# Patient Record
Sex: Female | Born: 1965 | Race: White | Hispanic: No | Smoking: Never smoker
Health system: Southern US, Community
[De-identification: ages and names within clinical notes are randomized; demographics above are authoritative.]

## PROBLEM LIST (undated history)

## (undated) DIAGNOSIS — G43909 Migraine, unspecified, not intractable, without status migrainosus: Secondary | ICD-10-CM

## (undated) HISTORY — DX: Migraine, unspecified, not intractable, without status migrainosus: G43.909

## (undated) HISTORY — PX: NO PAST SURGERIES: SHX2092

---

## 2018-10-29 ENCOUNTER — Ambulatory Visit (INDEPENDENT_AMBULATORY_CARE_PROVIDER_SITE_OTHER): Payer: BLUE CROSS/BLUE SHIELD

## 2018-10-29 ENCOUNTER — Ambulatory Visit: Payer: BLUE CROSS/BLUE SHIELD | Admitting: Sports Medicine

## 2018-10-29 ENCOUNTER — Encounter: Payer: Self-pay | Admitting: Sports Medicine

## 2018-10-29 DIAGNOSIS — M1711 Unilateral primary osteoarthritis, right knee: Secondary | ICD-10-CM

## 2018-10-29 DIAGNOSIS — M25562 Pain in left knee: Secondary | ICD-10-CM

## 2018-10-29 DIAGNOSIS — M7121 Synovial cyst of popliteal space [Baker], right knee: Secondary | ICD-10-CM

## 2018-10-29 DIAGNOSIS — M25561 Pain in right knee: Secondary | ICD-10-CM

## 2018-10-29 NOTE — Progress Notes (Signed)
Subjective:    I'm seeing this patient as a consultation for: Dr. Alona Bene  CC: Right knee pain  HPI: This is a pleasant 52 year old female, for many months she is had pain that she localizes over the posterior aspect of her right knee, her PCP ordered an ultrasound appropriately which showed a Baker's cyst.  She is here for further evaluation and definitive treatment, she does have some pain anteriorly in the knee with occasional swelling, moderate, persistent without radiation.  I reviewed the past medical history, family history, social history, surgical history, and allergies today and no changes were needed.  Please see the problem list section below in epic for further details.  Past Medical History: Past Medical History:  Diagnosis Date  . Migraine    Past Surgical History: Past Surgical History:  Procedure Laterality Date  . NO PAST SURGERIES     Social History: Social History   Socioeconomic History  . Marital status: Divorced    Spouse name: Not on file  . Number of children: Not on file  . Years of education: Not on file  . Highest education level: Not on file  Occupational History  . Not on file  Social Needs  . Financial resource strain: Not on file  . Food insecurity:    Worry: Not on file    Inability: Not on file  . Transportation needs:    Medical: Not on file    Non-medical: Not on file  Tobacco Use  . Smoking status: Never Smoker  . Smokeless tobacco: Never Used  Substance and Sexual Activity  . Alcohol use: Yes  . Drug use: Never  . Sexual activity: Not on file  Lifestyle  . Physical activity:    Days per week: Not on file    Minutes per session: Not on file  . Stress: Not on file  Relationships  . Social connections:    Talks on phone: Not on file    Gets together: Not on file    Attends religious service: Not on file    Active member of club or organization: Not on file    Attends meetings of clubs or organizations: Not on file    Relationship status: Not on file  Other Topics Concern  . Not on file  Social History Narrative  . Not on file   Family History: No family history on file. Allergies: Allergies not on file Medications: See med rec.  Review of Systems: No headache, visual changes, nausea, vomiting, diarrhea, constipation, dizziness, abdominal pain, skin rash, fevers, chills, night sweats, weight loss, swollen lymph nodes, body aches, joint swelling, muscle aches, chest pain, shortness of breath, mood changes, visual or auditory hallucinations.   Objective:   General: Well Developed, well nourished, and in no acute distress.  Neuro:  Extra-ocular muscles intact, able to move all 4 extremities, sensation grossly intact.  Deep tendon reflexes tested were normal. Psych: Alert and oriented, mood congruent with affect. ENT:  Ears and nose appear unremarkable.  Hearing grossly normal. Neck: Unremarkable overall appearance, trachea midline.  No visible thyroid enlargement. Eyes: Conjunctivae and lids appear unremarkable.  Pupils equal and round. Skin: Warm and dry, no rashes noted.  Cardiovascular: Pulses palpable, no extremity edema. Right knee: Normal to inspection with no erythema or effusion or obvious bony abnormalities. Palpable Baker's cyst on the posterior knee, minimal tenderness anteriorly and at the medial joint line ROM normal in flexion and extension and lower leg rotation. Ligaments with solid consistent endpoints  including ACL, PCL, LCL, MCL. Negative Mcmurray's and provocative meniscal tests. Non painful patellar compression. Patellar and quadriceps tendons unremarkable. Hamstring and quadriceps strength is normal.  Procedure: Real-time Ultrasound Guided aspiration/injection of Baker's cyst Device: GE Logiq E  Verbal informed consent obtained.  Time-out conducted.  Noted no overlying erythema, induration, or other signs of local infection.  Skin prepped in a sterile fashion.  Local  anesthesia: Topical Ethyl chloride.  With sterile technique and under real time ultrasound guidance: Using an 18-gauge needle advanced into a very large Baker's cyst, aspirated 7 mL of clear, straw-colored fluid, syringe switched and 1 cc Kenalog 40, 1 cc lidocaine injected easily Completed without difficulty  Pain immediately resolved suggesting accurate placement of the medication.  Advised to call if fevers/chills, erythema, induration, drainage, or persistent bleeding.  Images permanently stored and available for review in the ultrasound unit.  Impression: Technically successful ultrasound guided injection.  Procedure: Real-time Ultrasound Guided Injection of right knee Device: GE Logiq E  Verbal informed consent obtained.  Time-out conducted.  Noted no overlying erythema, induration, or other signs of local infection.  Skin prepped in a sterile fashion.  Local anesthesia: Topical Ethyl chloride.  With sterile technique and under real time ultrasound guidance: Patient flipped over, I then advanced a 25-gauge needle into the suprapatellar recess and injected 1 cc Kenalog 40, 2 cc lidocaine, 2 cc bupivacaine. Completed without difficulty  Pain immediately resolved suggesting accurate placement of the medication.  Advised to call if fevers/chills, erythema, induration, drainage, or persistent bleeding.  Images permanently stored and available for review in the ultrasound unit.  Impression: Technically successful ultrasound guided injection.  Impression and Recommendations:   This case required medical decision making of moderate complexity.  Primary osteoarthritis of right knee With a Baker's cyst. Baseline x-rays. Aspiration and injection of the knee and Baker's cyst. Strap with compressive dressing. Return to see me in a month. ___________________________________________ Ihor Austinhomas J. Benjamin Stainhekkekandam, M.D., ABFM., CAQSM. Primary Care and Sports Medicine [facility]  [facility]  Adjunct Professor of Family Medicine  [employer]

## 2018-10-29 NOTE — Assessment & Plan Note (Signed)
With a Baker's cyst. Baseline x-rays. Aspiration and injection of the knee and Baker's cyst. Strap with compressive dressing. Return to see me in a month.

## 2018-11-26 ENCOUNTER — Ambulatory Visit (INDEPENDENT_AMBULATORY_CARE_PROVIDER_SITE_OTHER): Payer: BLUE CROSS/BLUE SHIELD | Admitting: Sports Medicine

## 2018-11-26 ENCOUNTER — Encounter: Payer: Self-pay | Admitting: Sports Medicine

## 2018-11-26 DIAGNOSIS — M1711 Unilateral primary osteoarthritis, right knee: Secondary | ICD-10-CM

## 2018-11-26 NOTE — Assessment & Plan Note (Signed)
With the Baker's cyst, aspiration and injection performed a month ago, she returns today for the most part pain-free. Return as needed.

## 2018-11-26 NOTE — Progress Notes (Signed)
Subjective:    CC: Follow-up  HPI: Kimberly Summers returns, she is a pleasant 52 year old female, we did an aspiration and injection of a Baker's cyst, as well as an intra-articular injection at the last visit a month ago.  She did extremely well, she was completely pain-free, unfortunately tripped over some presents at Christmas, this created some increasing swelling but now she is back to a 1/10 on the pain scale, and continuing to improve.  I reviewed the past medical history, family history, social history, surgical history, and allergies today and no changes were needed.  Please see the problem list section below in epic for further details.  Past Medical History: Past Medical History:  Diagnosis Date  . Migraine    Past Surgical History: Past Surgical History:  Procedure Laterality Date  . NO PAST SURGERIES     Social History: Social History   Socioeconomic History  . Marital status: Divorced    Spouse name: Not on file  . Number of children: Not on file  . Years of education: Not on file  . Highest education level: Not on file  Occupational History  . Not on file  Social Needs  . Financial resource strain: Not on file  . Food insecurity:    Worry: Not on file    Inability: Not on file  . Transportation needs:    Medical: Not on file    Non-medical: Not on file  Tobacco Use  . Smoking status: Never Smoker  . Smokeless tobacco: Never Used  Substance and Sexual Activity  . Alcohol use: Yes  . Drug use: Never  . Sexual activity: Not on file  Lifestyle  . Physical activity:    Days per week: Not on file    Minutes per session: Not on file  . Stress: Not on file  Relationships  . Social connections:    Talks on phone: Not on file    Gets together: Not on file    Attends religious service: Not on file    Active member of club or organization: Not on file    Attends meetings of clubs or organizations: Not on file    Relationship status: Not on file  Other Topics  Concern  . Not on file  Social History Narrative  . Not on file   Family History: No family history on file. Allergies: No Known Allergies Medications: See med rec.  Review of Systems: No fevers, chills, night sweats, weight loss, chest pain, or shortness of breath.   Objective:    General: Well Developed, well nourished, and in no acute distress.  Neuro: Alert and oriented x3, extra-ocular muscles intact, sensation grossly intact.  HEENT: Normocephalic, atraumatic, pupils equal round reactive to light, neck supple, no masses, no lymphadenopathy, thyroid nonpalpable.  Skin: Warm and dry, no rashes. Cardiac: Regular rate and rhythm, no murmurs rubs or gallops, no lower extremity edema.  Respiratory: Clear to auscultation bilaterally. Not using accessory muscles, speaking in full sentences. Right knee: Normal to inspection with no erythema or effusion or obvious bony abnormalities. Palpation normal with no warmth or joint line tenderness or patellar tenderness or condyle tenderness. ROM normal in flexion and extension and lower leg rotation. Ligaments with solid consistent endpoints including ACL, PCL, LCL, MCL. Negative Mcmurray's and provocative meniscal tests. Non painful patellar compression. Patellar and quadriceps tendons unremarkable. Hamstring and quadriceps strength is normal.  Impression and Recommendations:    Primary osteoarthritis of right knee With the Baker's cyst, aspiration and injection performed  a month ago, she returns today for the most part pain-free. Return as needed. ___________________________________________ Kimberly Summers, M.D., ABFM., CAQSM. Primary Care and Sports [facility]  Adjunct Professor of Family Medicine  [employer]

## 2019-12-28 IMAGING — DX DG KNEE 1-2V*L*
4 series · 4 of 4 positions shown · non-contrast
Comparison: None.

CLINICAL DATA: Knee pain for several months. The patient reports a
Baker's cyst on the right. No known injury.

EXAM:
RIGHT KNEE - COMPLETE 4+ VIEW; LEFT KNEE - 1-2 VIEW

[knee lat]
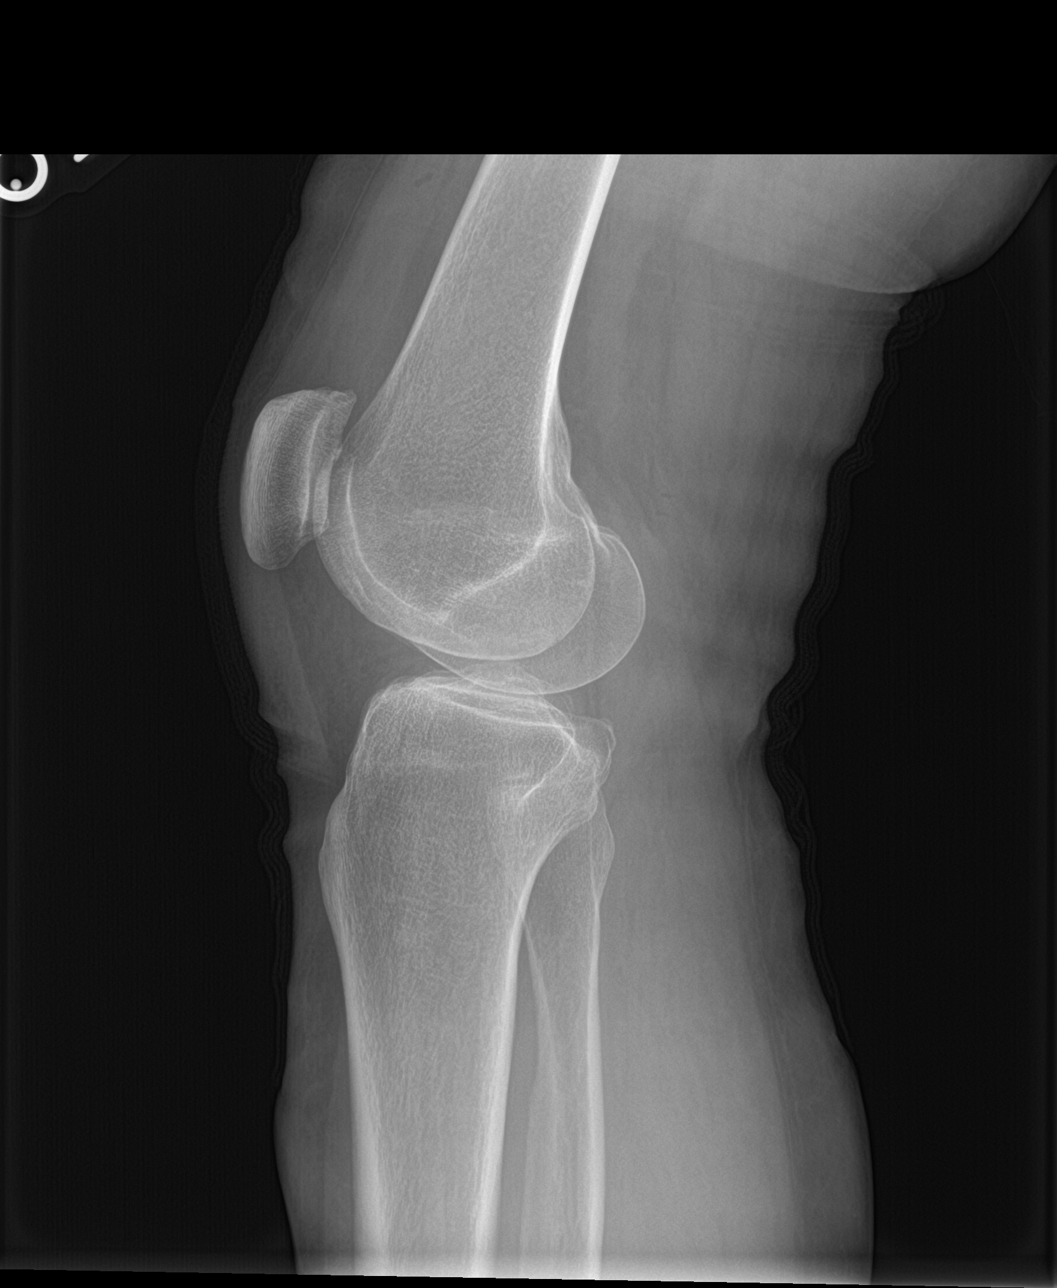

[knee sunrise]
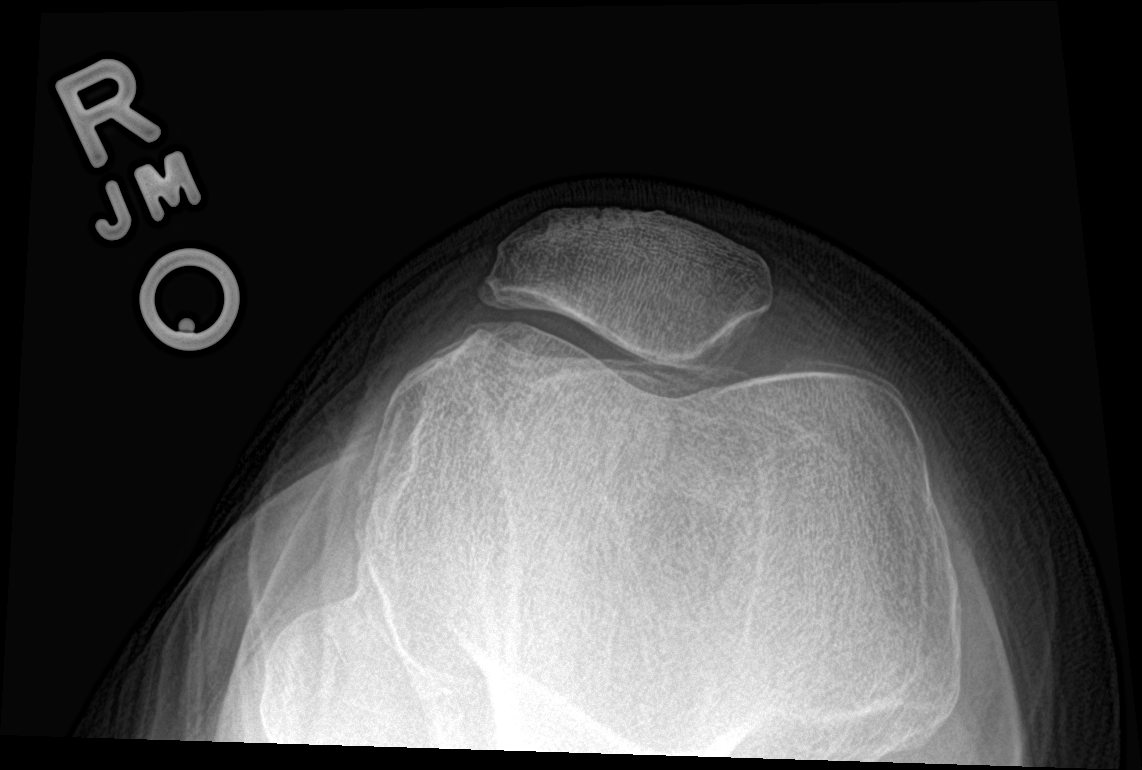

[knee ap bilat standing (1 of 2)]
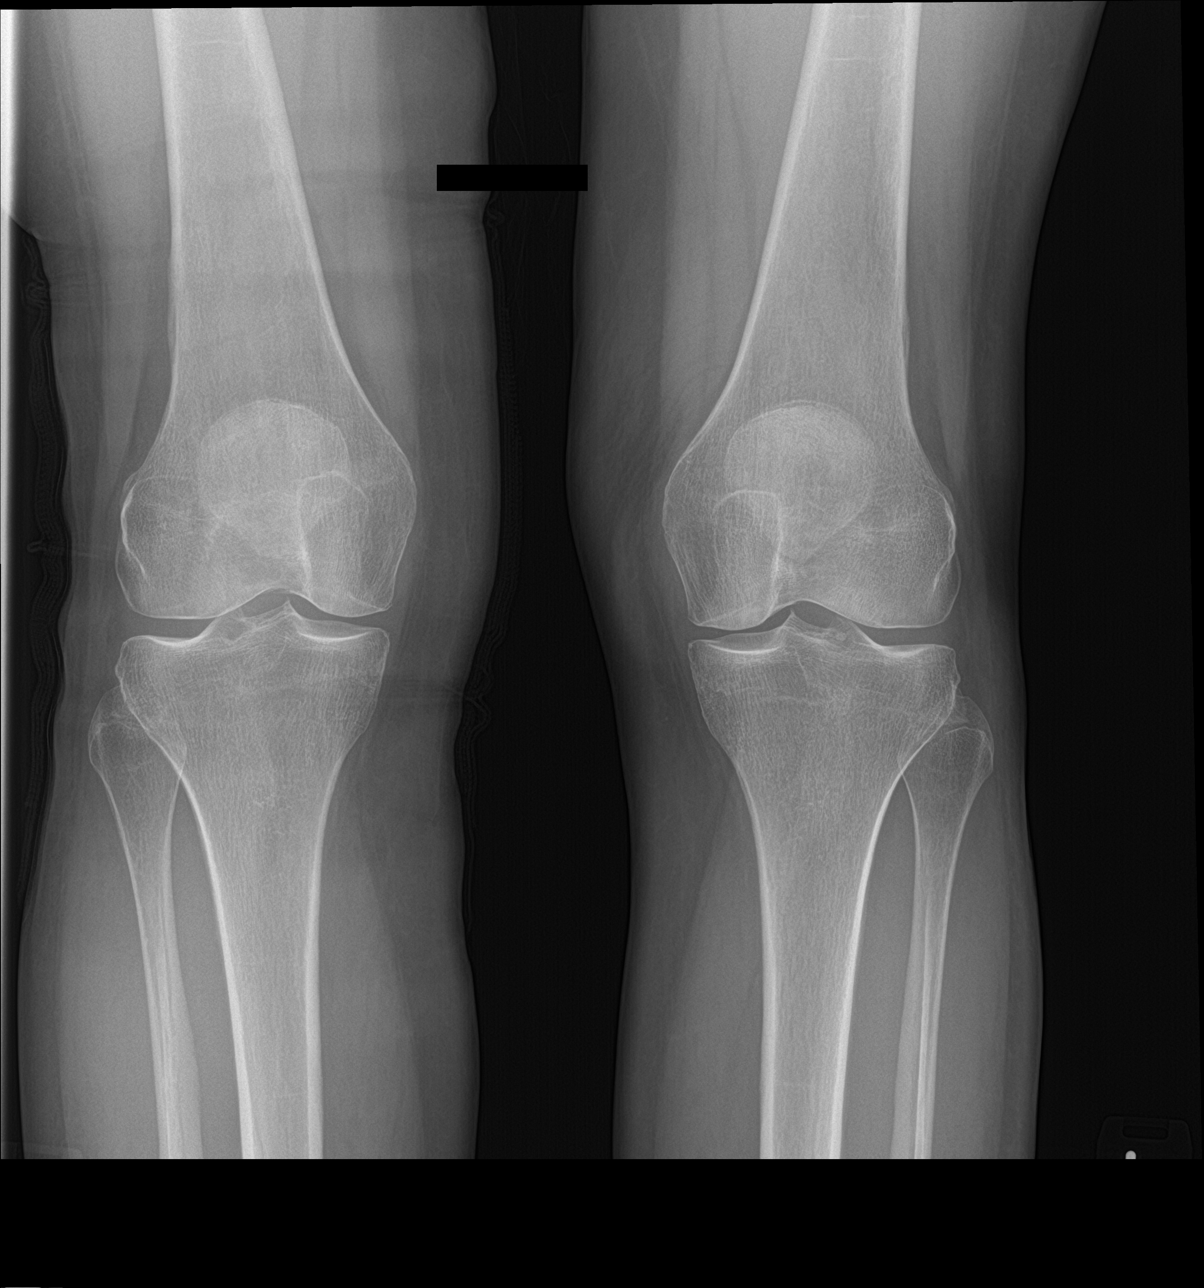

[knee ap bilat standing (2 of 2)]
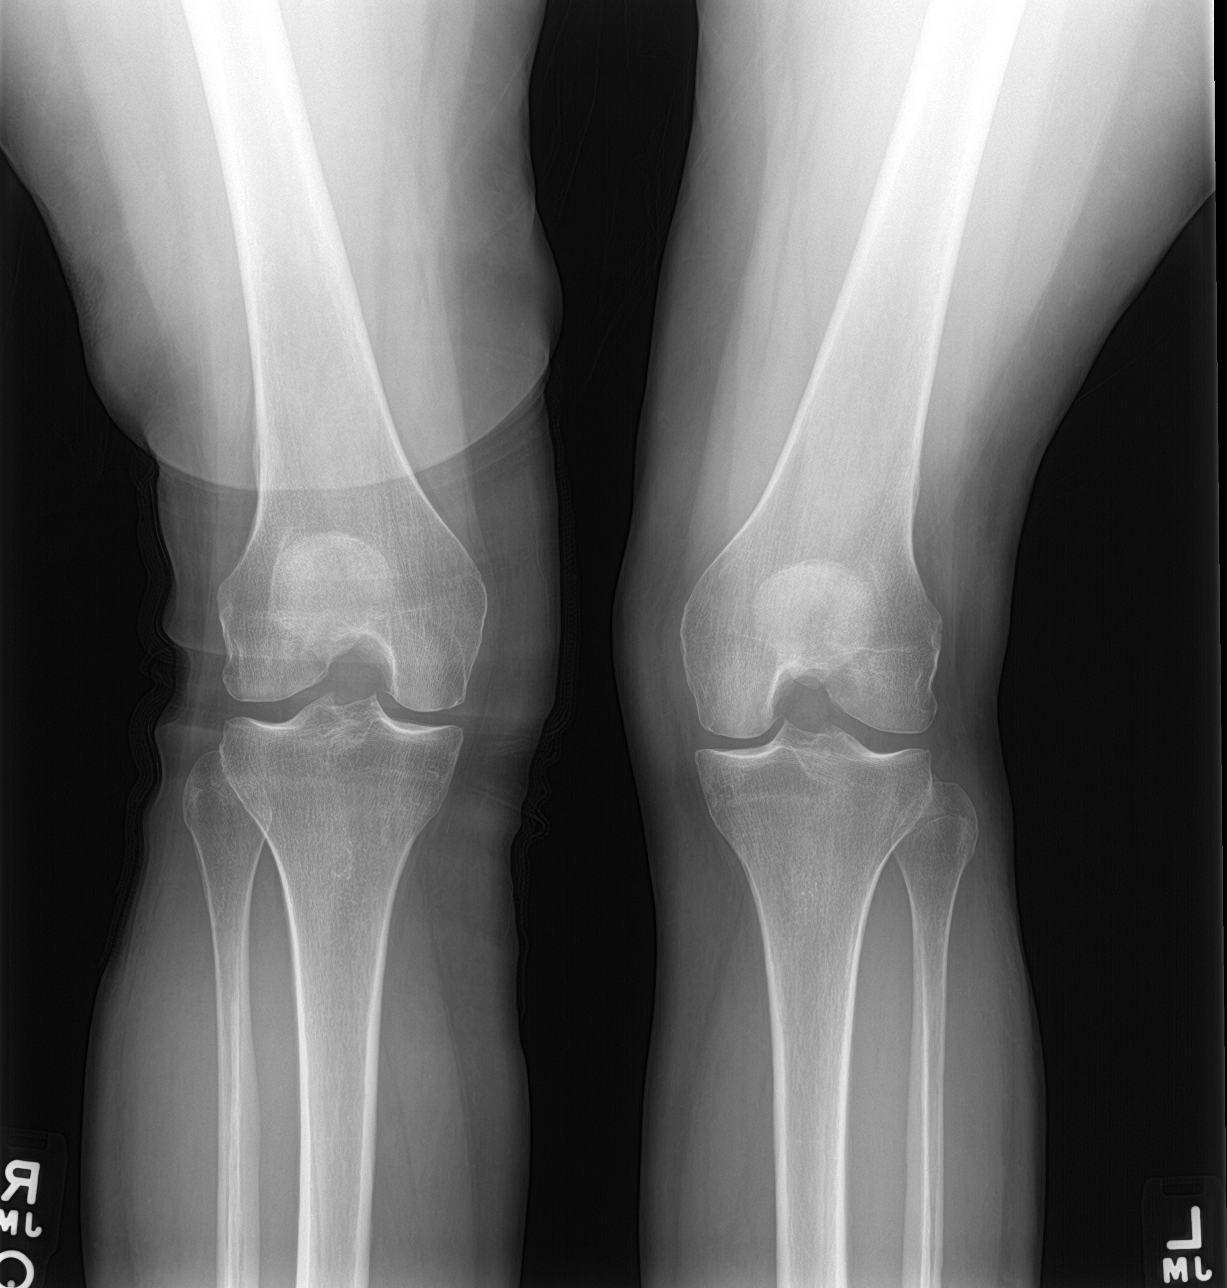

[4 of 4 positions shown; findings below may reference images not displayed]

FINDINGS: No evidence of fracture, dislocation, or joint effusion. No evidence
of arthropathy or other focal bone abnormality. Soft tissues are
unremarkable.
IMPRESSION: Normal exam.
